# Patient Record
Sex: Male | Born: 1970 | Race: Black or African American | Hispanic: No | Marital: Single
Health system: Southern US, Community
[De-identification: ages and names within clinical notes are randomized; demographics above are authoritative.]

---

## 2018-10-20 ENCOUNTER — Other Ambulatory Visit: Payer: Self-pay

## 2018-10-20 ENCOUNTER — Encounter (HOSPITAL_COMMUNITY): Payer: Self-pay | Admitting: *Deleted

## 2018-10-20 ENCOUNTER — Emergency Department (HOSPITAL_COMMUNITY)
Admission: EM | Admit: 2018-10-20 | Discharge: 2018-10-20 | Disposition: A | Discharge status: Home / Self Care | Payer: BLUE CROSS/BLUE SHIELD | Attending: Emergency Medicine | Admitting: Emergency Medicine

## 2018-10-20 ENCOUNTER — Emergency Department (HOSPITAL_COMMUNITY): Payer: BLUE CROSS/BLUE SHIELD

## 2018-10-20 DIAGNOSIS — M79641 Pain in right hand: Secondary | ICD-10-CM | POA: Diagnosis not present

## 2018-10-20 DIAGNOSIS — W2209XA Striking against other stationary object, initial encounter: Secondary | ICD-10-CM | POA: Insufficient documentation

## 2018-10-20 MED ORDER — IBUPROFEN 800 MG PO TABS: 800.0000 mg | ORAL_TABLET | Freq: Three times a day (TID) | ORAL | 0 refills | Status: AC

## 2018-10-20 MED ORDER — IBUPROFEN 800 MG PO TABS
800.0000 mg | ORAL_TABLET | Freq: Once | ORAL | Status: AC
  Administered 2018-10-20: 04:00:00 800 mg via ORAL
  Filled 2018-10-20: qty 1

## 2018-10-20 NOTE — ED Provider Notes (Addendum)
[facility]-EMERGENCY DEPT Provider Note   CSN: [id number] Arrival date & time: 10/20/18  0144     History   Chief Complaint Chief Complaint  Patient presents with  . Hand Pain    HPI Brendan Castro is a 48 y.o. male.     Patient presents to the emergency department with a chief complaint of right hand pain.  He states that he "accidentally punched a wall tonight around 10 PM."  He complains of moderate hand pain.  He states that the pain and swelling has worsened since trying to go to sleep.  He denies any numbness, or tingling.  He states that it is hard to make a tight fist.  He denies any other injuries.  Denies any treatments prior to arrival.  The history is provided by the patient. No language interpreter was used.    History reviewed. No pertinent past medical history.  There are no active problems to display for this patient.   History reviewed. No pertinent surgical history.      Home Medications    Prior to Admission medications   Medication Sig Start Date End Date Taking? Authorizing Provider  ibuprofen (ADVIL) 800 MG tablet Take 1 tablet (800 mg total) by mouth 3 (three) times daily. 10/20/18   Roxy Horseman, PA-C    Family History No family history on file.  Social History Social History   Tobacco Use  . Smoking status: Not on file  Substance Use Topics  . Alcohol use: Not on file  . Drug use: Not on file     Allergies   Patient has no known allergies.   Review of Systems Review of Systems  All other systems reviewed and are negative.    Physical Exam Updated Vital Signs BP (!) 150/93   Pulse (!) 105   Temp 98.8 F (37.1 C) (Oral)   Resp 16   SpO2 99%   Physical Exam Vitals signs and nursing note reviewed.  Constitutional:      General: He is not in acute distress.    Appearance: He is well-developed. He is not ill-appearing.  HENT:     Head: Normocephalic and atraumatic.  Eyes:   Conjunctiva/sclera: Conjunctivae normal.  Neck:     Musculoskeletal: Neck supple.  Cardiovascular:     Rate and Rhythm: Normal rate.     Comments: Intact distal pulses Brisk capillary refill Pulmonary:     Effort: Pulmonary effort is normal. No respiratory distress.  Abdominal:     General: There is no distension.  Musculoskeletal:     Comments: Moderate swelling over the second and third metacarpals of the right hand, range of motion of the right fingers is limited secondary to pain Tenderness to palpation about the right wrist No snuffbox tenderness  Skin:    General: Skin is warm and dry.  Neurological:     Mental Status: He is alert and oriented to person, place, and time.     Comments: Distal sensation intact  Psychiatric:        Mood and Affect: Mood normal.        Behavior: Behavior normal.      ED Treatments / Results  Labs (all labs ordered are listed, but only abnormal results are displayed) Labs Reviewed - No data to display  EKG None  Radiology Dg Hand Complete Right  Result Date: 10/20/2018 CLINICAL DATA:  Punched a wall EXAM: RIGHT HAND - COMPLETE 3+ VIEW COMPARISON:  None. FINDINGS: There is no  evidence of fracture or dislocation. There is no evidence of arthropathy or other focal bone abnormality. Soft tissues are unremarkable. IMPRESSION: Negative. Electronically Signed   By: Ulyses Jarred M.D.   On: 10/20/2018 02:49    Procedures Procedures (including critical care time)  Medications Ordered in ED Medications  ibuprofen (ADVIL) tablet 800 mg (has no administration in time range)     Initial Impression / Assessment and Plan / ED Course  I have reviewed the triage vital signs and the nursing notes.  Pertinent labs & imaging results that were available during my care of the patient were reviewed by me and considered in my medical decision making (see chart for details).        Patient presents with injury to right hand.  DDx includes,  fracture, strain, or sprain.  Consultants: none  Plain films reveal no fracture or dislocation.  Pt advised to follow up with PCP and/or orthopedics. Patient given splint while in ED, conservative therapy such as RICE recommended and discussed.   Patient will be discharged home & is agreeable with above plan. Returns precautions discussed. Pt appears safe for discharge.  5:00 AM Received phone call from x-ray tech, concerned about possible fracture in the base of the second metacarpal.  I visualized the x-ray, and agree.  I discussed case with Dr. Collins Scotland from radiology, who also agrees, and will addend his note.  I called back by telephone Mr. Crescenzo and informed him of the results.  I have advised him to follow-up with a hand surgeon.  I do not believe that this changes his care plan, he is to continue wearing his splint and taking the anti-inflammatory as prescribed.  Patient understands and agrees with the plan.   Final Clinical Impressions(s) / ED Diagnoses   Final diagnoses:  Right hand pain    ED Discharge Orders         Ordered    ibuprofen (ADVIL) 800 MG tablet  3 times daily     10/20/18 0348           Montine Circle, PA-C 10/20/18 0351    Montine Circle, PA-C 10/20/18 0501    Fatima Blank, MD 10/23/18 2111

## 2018-10-20 NOTE — ED Triage Notes (Signed)
Right hand  pain and swelling after hitting a wall tonight around 2200

## 2020-10-22 IMAGING — CR DG HAND COMPLETE 3+V*R*
4 series · 4 of 4 positions shown · non-contrast
Comparison: None.
COMPARISON: None.

Addendum:
CLINICAL DATA: Punched a wall

EXAM:
RIGHT HAND - COMPLETE 3+ VIEW

[x hand pa right]
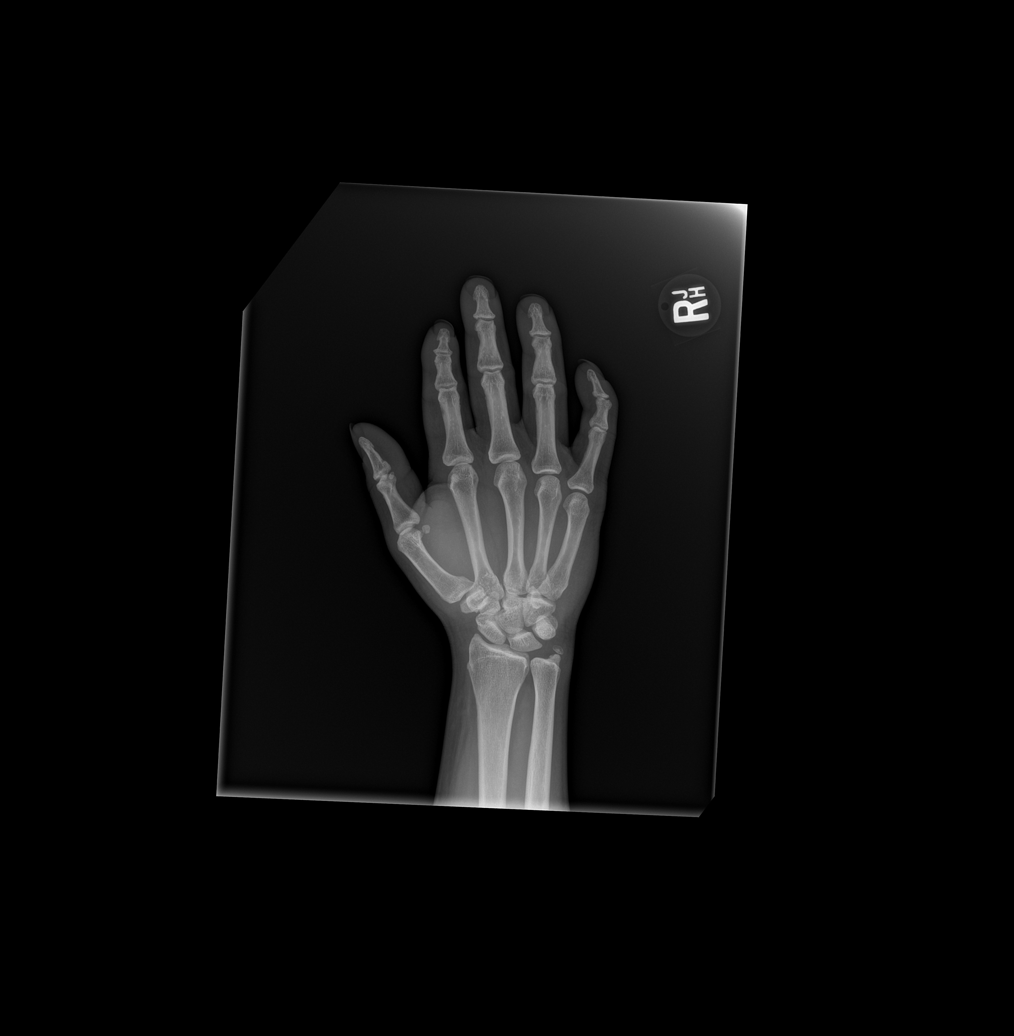

[x hand obl right]
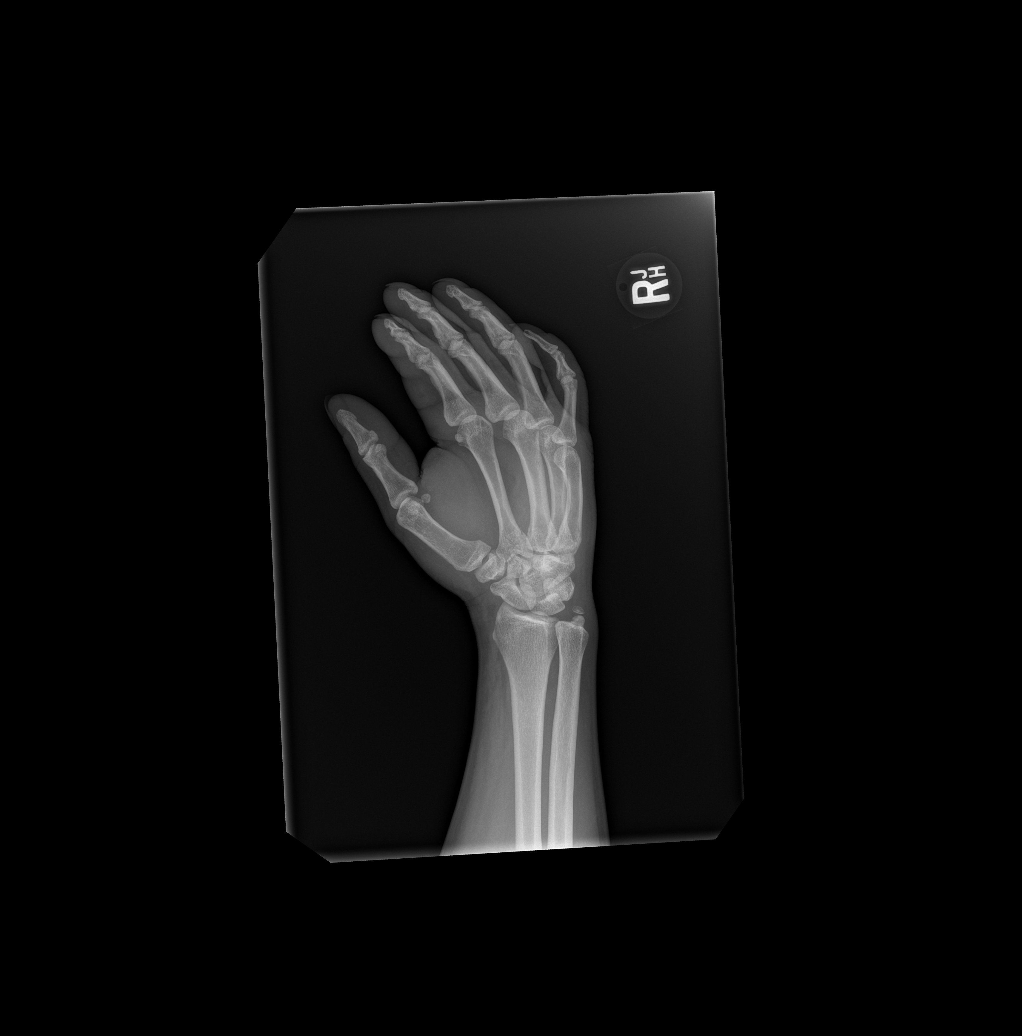

[x hand lat right (1 of 2)]
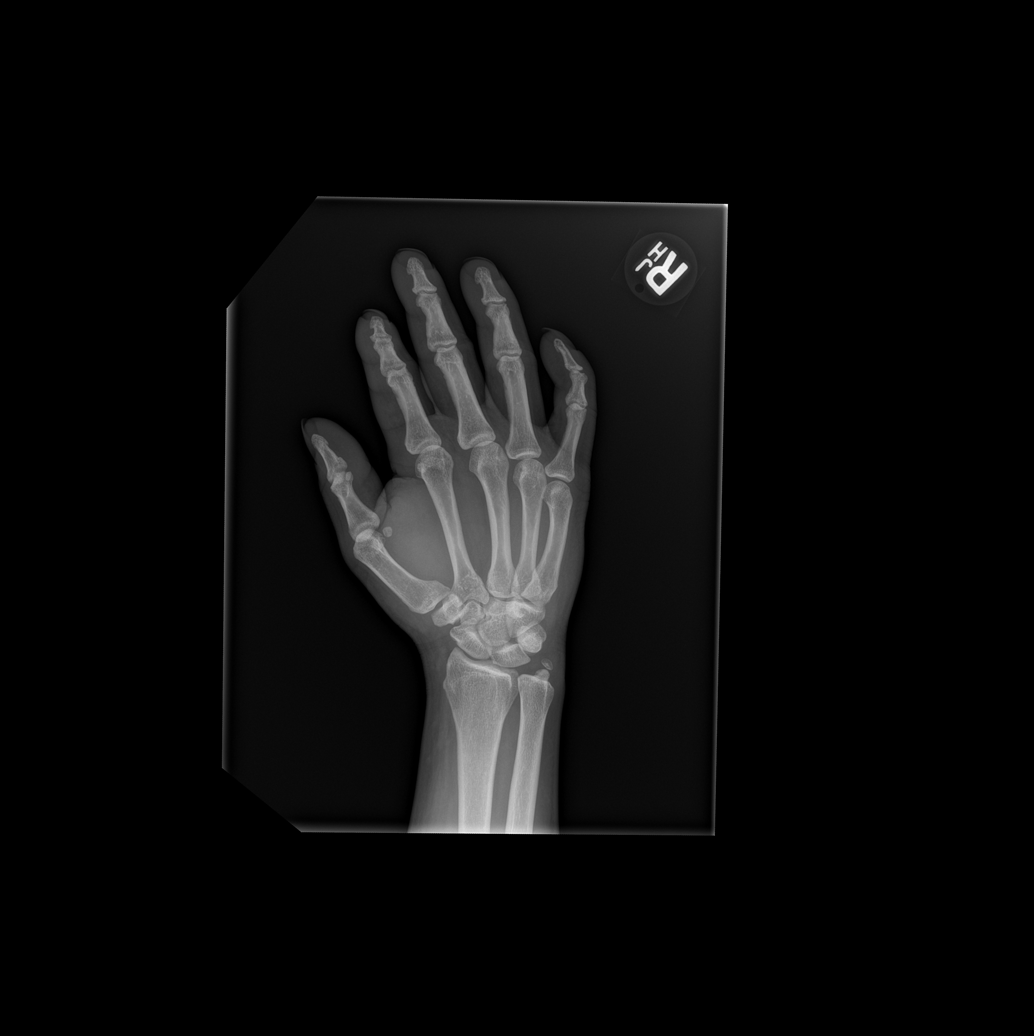

[x hand lat right (2 of 2)]
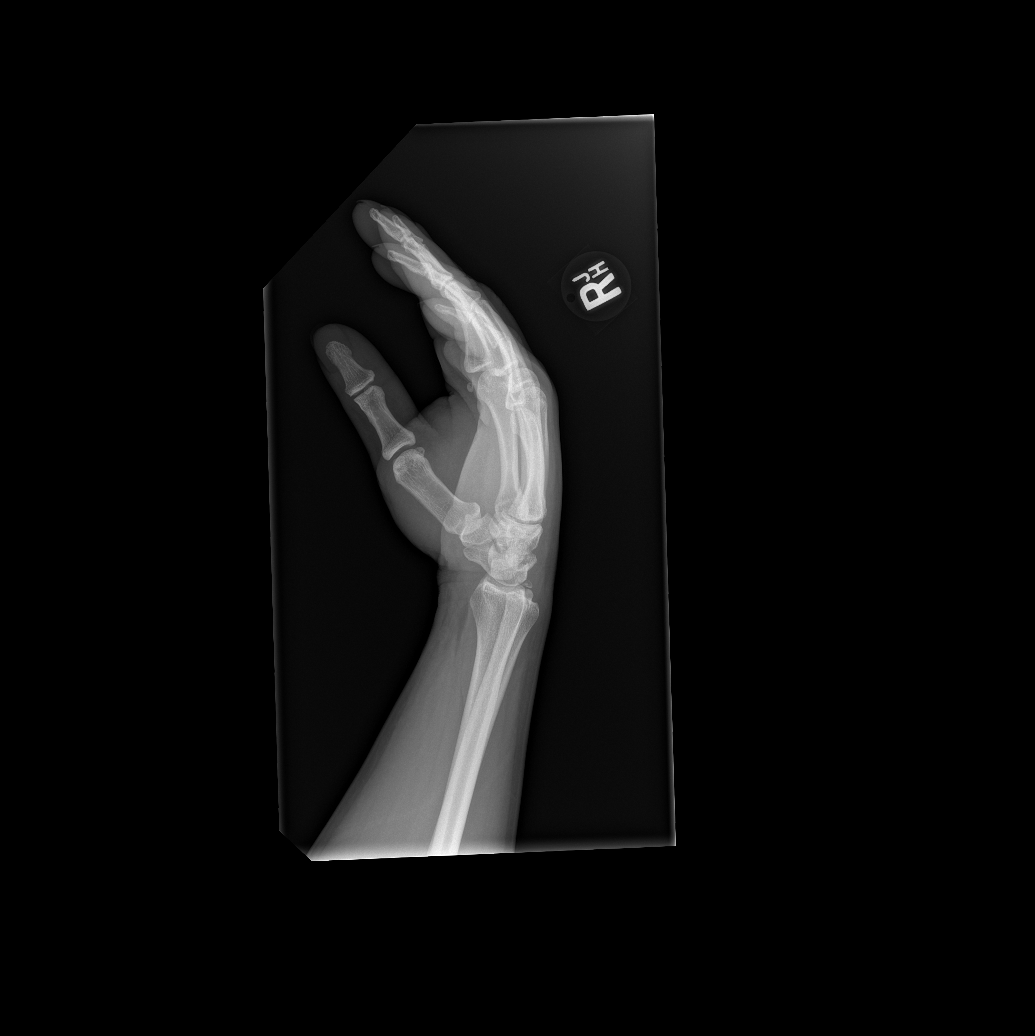

[4 of 4 positions shown; findings below may reference images not displayed]

FINDINGS: There is no evidence of fracture or dislocation. There is no
evidence of arthropathy or other focal bone abnormality. Soft
tissues are unremarkable.
IMPRESSION: Negative.

ADDENDUM:
There is a nondisplaced fracture at the base of the right second
metacarpal.

*** End of Addendum ***
FINDINGS: There is no evidence of fracture or dislocation. There is no
evidence of arthropathy or other focal bone abnormality. Soft
tissues are unremarkable.
IMPRESSION: Negative.
